# Patient Record
Sex: Female | Born: 2000 | Race: White | Hispanic: No | Marital: Single | Smoking: Never smoker
Health system: Southern US, Community
[De-identification: ages and names within clinical notes are randomized; demographics above are authoritative.]

## PROBLEM LIST (undated history)

## (undated) DIAGNOSIS — R0689 Other abnormalities of breathing: Secondary | ICD-10-CM

## (undated) DIAGNOSIS — F419 Anxiety disorder, unspecified: Secondary | ICD-10-CM

## (undated) DIAGNOSIS — R000 Tachycardia, unspecified: Secondary | ICD-10-CM

## (undated) DIAGNOSIS — E663 Overweight: Secondary | ICD-10-CM

## (undated) DIAGNOSIS — N1370 Vesicoureteral-reflux, unspecified: Secondary | ICD-10-CM

## (undated) HISTORY — PX: NO PAST SURGERIES: SHX2092

## (undated) HISTORY — DX: Vesicoureteral-reflux, unspecified: N13.70

## (undated) HISTORY — DX: Overweight: E66.3

## (undated) HISTORY — DX: Other abnormalities of breathing: R06.89

## (undated) HISTORY — DX: Tachycardia, unspecified: R00.0

---
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
## 2022-05-07 DIAGNOSIS — N1370 Vesicoureteral-reflux, unspecified: Secondary | ICD-10-CM | POA: Insufficient documentation

## 2022-05-07 DIAGNOSIS — R21 Rash and other nonspecific skin eruption: Secondary | ICD-10-CM | POA: Insufficient documentation

## 2022-05-07 DIAGNOSIS — Z91038 Other insect allergy status: Secondary | ICD-10-CM | POA: Insufficient documentation

## 2022-05-07 DIAGNOSIS — L509 Urticaria, unspecified: Secondary | ICD-10-CM | POA: Insufficient documentation

## 2022-05-07 DIAGNOSIS — B354 Tinea corporis: Secondary | ICD-10-CM | POA: Insufficient documentation

## 2022-05-07 DIAGNOSIS — E65 Localized adiposity: Secondary | ICD-10-CM | POA: Insufficient documentation

## 2022-05-07 DIAGNOSIS — S01502A Unspecified open wound of oral cavity, initial encounter: Secondary | ICD-10-CM | POA: Insufficient documentation

## 2022-05-07 DIAGNOSIS — E663 Overweight: Secondary | ICD-10-CM | POA: Insufficient documentation

## 2022-05-07 DIAGNOSIS — R112 Nausea with vomiting, unspecified: Secondary | ICD-10-CM | POA: Insufficient documentation

## 2022-05-07 DIAGNOSIS — R509 Fever, unspecified: Secondary | ICD-10-CM | POA: Insufficient documentation

## 2022-05-07 DIAGNOSIS — R011 Cardiac murmur, unspecified: Secondary | ICD-10-CM | POA: Insufficient documentation

## 2022-05-07 DIAGNOSIS — R059 Cough, unspecified: Secondary | ICD-10-CM | POA: Insufficient documentation

## 2022-05-07 DIAGNOSIS — Z23 Encounter for immunization: Secondary | ICD-10-CM | POA: Insufficient documentation

## 2022-05-07 DIAGNOSIS — K529 Noninfective gastroenteritis and colitis, unspecified: Secondary | ICD-10-CM | POA: Insufficient documentation

## 2022-05-07 DIAGNOSIS — D72829 Elevated white blood cell count, unspecified: Secondary | ICD-10-CM | POA: Insufficient documentation

## 2022-05-07 DIAGNOSIS — J45990 Exercise induced bronchospasm: Secondary | ICD-10-CM | POA: Insufficient documentation

## 2022-05-07 DIAGNOSIS — J45909 Unspecified asthma, uncomplicated: Secondary | ICD-10-CM | POA: Insufficient documentation

## 2022-05-07 DIAGNOSIS — L42 Pityriasis rosea: Secondary | ICD-10-CM | POA: Insufficient documentation

## 2022-05-07 DIAGNOSIS — K5909 Other constipation: Secondary | ICD-10-CM | POA: Insufficient documentation

## 2022-05-07 DIAGNOSIS — R0689 Other abnormalities of breathing: Secondary | ICD-10-CM | POA: Insufficient documentation

## 2022-05-07 DIAGNOSIS — M439 Deforming dorsopathy, unspecified: Secondary | ICD-10-CM | POA: Insufficient documentation

## 2022-05-08 ENCOUNTER — Ambulatory Visit: Payer: Self-pay

## 2022-05-08 ENCOUNTER — Ambulatory Visit: Payer: Self-pay | Admitting: Physician Assistant

## 2022-05-08 ENCOUNTER — Encounter: Payer: Self-pay | Admitting: Physician Assistant

## 2022-05-08 VITALS — BP 118/77 | HR 91 | Temp 97.7°F | Resp 12 | Ht 59.0 in | Wt 160.0 lb

## 2022-05-08 DIAGNOSIS — Z021 Encounter for pre-employment examination: Secondary | ICD-10-CM

## 2022-05-08 DIAGNOSIS — Z0000 Encounter for general adult medical examination without abnormal findings: Secondary | ICD-10-CM

## 2022-05-08 LAB — POCT URINALYSIS DIPSTICK
Bilirubin, UA: NEGATIVE
Blood, UA: NEGATIVE
Glucose, UA: NEGATIVE
Ketones, UA: NEGATIVE
Leukocytes, UA: NEGATIVE
Nitrite, UA: NEGATIVE
Protein, UA: NEGATIVE
Spec Grav, UA: 1.01 (ref 1.010–1.025)
Urobilinogen, UA: 0.2 E.U./dL
pH, UA: 6 (ref 5.0–8.0)

## 2022-05-08 NOTE — Progress Notes (Signed)
Pt presents today to complete pre-employment [employer] UDS and physical.

## 2022-05-08 NOTE — Progress Notes (Signed)
[facility]  ____________________________________________   None    (approximate)  I have reviewed the triage vital signs and the nursing notes.   HISTORY  Chief Complaint No chief complaint on file.    HPI Denise Richard is a 21 y.o. female patient presents for employment physical for the [employer].  Patient is applying for telemetry communicator.         Past Medical History:  Diagnosis Date   Anxiety    Breath holding episodes    seen by cardiologist in the past   Overweight child    Reflux, vesicoureteral    Seen by Urologist in the past and had voiding test performed.   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   Breath holding episodes 05/07/2022   [place] hump 05/07/2022   Chronic constipation 05/07/2022   Cough 05/07/2022   Curvature of spine 05/07/2022   Dermatophytosis of body 05/07/2022   Elevated WBC count 05/07/2022   Fever 05/07/2022   Gastroenteritis 05/07/2022   Nausea with vomiting 05/07/2022   Need for varicella vaccine 05/07/2022   Open wound of tongue and floor of mouth 05/07/2022   Overweight child 05/07/2022   Pityriasis rosea 05/07/2022   RAD (reactive airway disease) 05/07/2022   Rash and other nonspecific skin eruption 05/07/2022   Reflux, vesicoureteral 05/07/2022   Undiagnosed cardiac murmurs 05/07/2022   Urticaria 05/07/2022   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 Authorizing Provider  aspirin EC 325 MG tablet Take by mouth. 11/08/18   [provider]  metFORMIN (GLUCOPHAGE) 500 MG tablet Take by mouth. 03/21/21   [provider]  norethindrone-ethinyl estradiol (JUNEL FE,GILDESS FE,LOESTRIN FE) 1-20 MG-MCG tablet Take 1 tablet by mouth daily. 02/02/17 02/02/18  [provider]    Allergies Pecan pollen allergy skin test and Juniper tar  Family History  Problem Relation Age of Onset   Cancer Paternal Aunt        Great Aunt-Breast   Hyperlipidemia Maternal Grandfather    Heart disease Maternal Grandfather    Blindness Maternal Grandfather    Diabetes Paternal Grandfather    Anxiety disorder Mother    Hyperlipidemia Maternal Grandmother    Anxiety disorder Maternal Grandmother    Anxiety disorder Paternal Grandmother     Social History Social History   Tobacco Use   Smoking status: Never   Smokeless tobacco: Never  Vaping Use   Vaping Use: Never used  Substance Use Topics   Alcohol use: No    Alcohol/week: 0.0 standard drinks of alcohol   Drug use: No    Review of Systems Constitutional: No fever/chills Eyes: No visual changes. ENT: No sore throat. Cardiovascular: Denies chest pain. Respiratory: Denies shortness of breath.  Asthma. Gastrointestinal: No abdominal pain.  No nausea, no vomiting.  No diarrhea.  No constipation. Genitourinary: Negative for dysuria.  Musculoskeletal: Negative for back pain. Skin: Negative for rash. Neurological: Negative for headaches, focal weakness or numbness. Psychiatric: Anxiety Allergic/Immunilogical: Pecan pollen ____________________________________________   PHYSICAL EXAM:  VITAL SIGNS: Constitutional: Alert and oriented. Well appearing and in no acute distress. Eyes: Conjunctivae are normal. PERRL. EOMI. Head: Atraumatic. Nose: No congestion/rhinnorhea. Mouth/Throat: Mucous membranes are moist.   Oropharynx non-erythematous. Neck: No stridor.  No cervical spine tenderness to palpation. Hematological/Lymphatic/Immunilogical: No cervical lymphadenopathy. Cardiovascular: Normal rate, regular rhythm. Grossly normal heart sounds.  Good peripheral circulation. Respiratory: Normal respiratory effort.  No retractions. Lungs CTAB. Gastrointestinal: Soft and nontender. No distention. No abdominal bruits. No CVA tenderness. Genitourinary: Deferred Musculoskeletal: No lower extremity tenderness nor edema.  No joint effusions. Neurologic:  Normal speech and language. No gross focal neurologic deficits are appreciated. No gait instability. Skin:  Skin is warm, dry and intact. No rash noted. Psychiatric: Mood and affect are normal. Speech and behavior are normal.  ____________________________________________   LABS  __Pending __________________________________________    ____________________________________________   INITIAL IMPRESSION / ASSESSMENT AND PLAN  As part of my medical decision making, I reviewed the following data within the electronic MEDICAL RECORD NUMBER              ____________________________________________   FINAL CLINICAL IMPRESSION  On exam  ED Discharge Orders     None        Note:  This document was prepared using Dragon voice recognition software and may include unintentional dictation errors.

## 2022-05-09 LAB — CMP12+LP+TP+TSH+6AC+CBC/D/PLT
ALT: 28 IU/L (ref 0–32)
AST: 25 IU/L (ref 0–40)
Albumin/Globulin Ratio: 2 (ref 1.2–2.2)
Albumin: 4.8 g/dL (ref 3.9–5.0)
Alkaline Phosphatase: 160 IU/L — ABNORMAL HIGH (ref 42–106)
BUN/Creatinine Ratio: 15 (ref 9–23)
BUN: 9 mg/dL (ref 6–20)
Basophils Absolute: 0.1 10*3/uL (ref 0.0–0.2)
Basos: 1 %
Bilirubin Total: 0.3 mg/dL (ref 0.0–1.2)
Calcium: 9.7 mg/dL (ref 8.7–10.2)
Chloride: 99 mmol/L (ref 96–106)
Chol/HDL Ratio: 3 ratio (ref 0.0–4.4)
Cholesterol, Total: 177 mg/dL — ABNORMAL HIGH (ref 100–169)
Creatinine, Ser: 0.61 mg/dL (ref 0.57–1.00)
EOS (ABSOLUTE): 0.3 10*3/uL (ref 0.0–0.4)
Eos: 3 %
Estimated CHD Risk: <0.5 times avg.
Free Thyroxine Index: 1.6 (ref 1.2–4.9)
GGT: 18 IU/L (ref 0–60)
Globulin, Total: 2.4 g/dL (ref 1.5–4.5)
Glucose: 65 mg/dL — ABNORMAL LOW (ref 70–99)
HDL: 60 mg/dL (ref >39)
Hematocrit: 41.5 % (ref 34.0–46.6)
Hemoglobin: 13.7 g/dL (ref 11.1–15.9)
Immature Grans (Abs): 0 10*3/uL (ref 0.0–0.1)
Immature Granulocytes: 0 %
Iron: 85 ug/dL (ref 27–159)
LDH: 225 IU/L (ref 119–226)
LDL Chol Calc (NIH): 92 mg/dL (ref 0–109)
Lymphocytes Absolute: 3.3 10*3/uL — ABNORMAL HIGH (ref 0.7–3.1)
Lymphs: 32 %
MCH: 27.7 pg (ref 26.6–33.0)
MCHC: 33 g/dL (ref 31.5–35.7)
MCV: 84 fL (ref 79–97)
Monocytes Absolute: 0.6 10*3/uL (ref 0.1–0.9)
Monocytes: 6 %
Neutrophils Absolute: 6 10*3/uL (ref 1.4–7.0)
Neutrophils: 58 %
Phosphorus: 4.3 mg/dL (ref 3.3–5.1)
Platelets: 335 10*3/uL (ref 150–450)
Potassium: 4.4 mmol/L (ref 3.5–5.2)
RBC: 4.94 x10E6/uL (ref 3.77–5.28)
RDW: 12.7 % (ref 11.7–15.4)
Sodium: 139 mmol/L (ref 134–144)
T3 Uptake Ratio: 23 % (ref 23–35)
T4, Total: 6.8 ug/dL (ref 4.5–12.0)
TSH: 1.85 u[IU]/mL (ref 0.450–4.500)
Total Protein: 7.2 g/dL (ref 6.0–8.5)
Triglycerides: 144 mg/dL — ABNORMAL HIGH (ref 0–89)
Uric Acid: 4.4 mg/dL (ref 2.6–6.2)
VLDL Cholesterol Cal: 25 mg/dL (ref 5–40)
WBC: 10.2 10*3/uL (ref 3.4–10.8)
eGFR: 133 mL/min/{1.73_m2} (ref >59)

## 2022-05-12 LAB — QUANTIFERON-TB GOLD PLUS
QuantiFERON Mitogen Value: >10 IU/mL
QuantiFERON Nil Value: 0.08 IU/mL
QuantiFERON TB1 Ag Value: 0.06 IU/mL
QuantiFERON TB2 Ag Value: 0.06 IU/mL
QuantiFERON-TB Gold Plus: NEGATIVE

## 2024-03-01 ENCOUNTER — Ambulatory Visit
Admission: EM | Admit: 2024-03-01 | Discharge: 2024-03-01 | Disposition: A | Discharge status: Home / Self Care | Attending: Emergency Medicine | Admitting: Emergency Medicine

## 2024-03-01 ENCOUNTER — Other Ambulatory Visit: Payer: Self-pay

## 2024-03-01 ENCOUNTER — Encounter: Payer: Self-pay | Admitting: Emergency Medicine

## 2024-03-01 DIAGNOSIS — J069 Acute upper respiratory infection, unspecified: Secondary | ICD-10-CM

## 2024-03-01 DIAGNOSIS — H6502 Acute serous otitis media, left ear: Secondary | ICD-10-CM

## 2024-03-01 LAB — POC COVID19/FLU A&B COMBO
Covid Antigen, POC: NEGATIVE
Influenza A Antigen, POC: NEGATIVE
Influenza B Antigen, POC: NEGATIVE

## 2024-03-01 MED ORDER — PREDNISONE 10 MG (21) PO TBPK: ORAL_TABLET | Freq: Every day | ORAL | 0 refills | Status: AC

## 2024-03-01 MED ORDER — BENZONATATE 100 MG PO CAPS: 100.0000 mg | ORAL_CAPSULE | Freq: Three times a day (TID) | ORAL | 0 refills | Status: AC

## 2024-03-01 MED ORDER — AMOXICILLIN-POT CLAVULANATE 875-125 MG PO TABS: 1.0000 | ORAL_TABLET | Freq: Two times a day (BID) | ORAL | 0 refills | Status: AC

## 2024-03-01 MED ORDER — PROMETHAZINE-DM 6.25-15 MG/5ML PO SYRP: 5.0000 mL | ORAL_SOLUTION | Freq: Every evening | ORAL | 0 refills | Status: AC | PRN

## 2024-03-01 NOTE — Discharge Instructions (Signed)
 Begin Augmentin every morning and every evening for 7 days for treatment of infection to the left ear will provide protection to the lungs  Begin prednisone starting tomorrow morning, take with food as directed, will open to relax airway and ideally settle the harshness of your cough  You may use Tessalon pill every 8 hours as needed for cough, use cough syrup at bedtime if having difficulty sleeping    You can take Tylenol and/or Ibuprofen as needed for fever reduction and pain relief.   For cough: honey 1/2 to 1 teaspoon (you can dilute the honey in water or another fluid).  You can also use guaifenesin and dextromethorphan for cough. You can use a humidifier for chest congestion and cough.  If you don't have a humidifier, you can sit in the bathroom with the hot shower running.      For sore throat: try warm salt water gargles, cepacol lozenges, throat spray, warm tea or water with lemon/honey, popsicles or ice, or OTC cold relief medicine for throat discomfort.   For congestion: take a daily anti-histamine like Zyrtec, Claritin, and a oral decongestant, such as pseudoephedrine.  You can also use Flonase 1-2 sprays in each nostril daily.   It is important to stay hydrated: drink plenty of fluids (water, gatorade/powerade/pedialyte, juices, or teas) to keep your throat moisturized and help further relieve irritation/discomfort.

## 2024-03-01 NOTE — ED Provider Notes (Signed)
 Renaldo Fiddler    CSN: [id number] Arrival date & time: 03/01/24  1911      History   Chief Complaint Chief Complaint  Patient presents with   Fever    HPI Denise Richard is a 23 y.o. female.   Patient presents for evaluation of a fever peaking at 101.2, nasal congestion, rhinorrhea, productive cough, sore throat and bilateral ear fullness present for 3 days.  Ear fullness described as a sensation of needing the ears to pop but denies pain, drainage or decreased hearing.  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  Decreased appetite but able to tolerate food and liquids.  Denies shortness of breath and wheezing.  Has attempted use of ibuprofen and Alka-Seltzer which has been minimally effective.   Past Medical History:  Diagnosis Date   Anxiety    Breath holding episodes    seen by cardiologist in the past   Overweight child    Reflux, vesicoureteral    Seen by Urologist in the past and had voiding test performed.   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   Breath holding episodes 05/07/2022   Dorsocervical fat pad 05/07/2022   Chronic constipation 05/07/2022   Cough 05/07/2022   Curvature of spine 05/07/2022   Dermatophytosis of body 05/07/2022   Elevated WBC count 05/07/2022   Fever 05/07/2022   Gastroenteritis 05/07/2022   Nausea with vomiting 05/07/2022   Need for varicella vaccine 05/07/2022   Open wound of tongue and floor of mouth 05/07/2022   Overweight child 05/07/2022   Pityriasis rosea 05/07/2022   RAD (reactive airway disease) 05/07/2022   Rash and other nonspecific skin eruption 05/07/2022   Reflux, vesicoureteral 05/07/2022   Undiagnosed cardiac murmurs 05/07/2022   Urticaria 05/07/2022   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      Home Medications    Prior to Admission medications   Medication Sig Start Date End Date Taking? Authorizing Provider  amoxicillin-clavulanate (AUGMENTIN) 875-125 MG tablet Take 1 tablet by mouth every 12 (twelve) hours. 03/01/24  Yes Britt Theard R, NP  benzonatate (TESSALON) 100 MG capsule Take 1 capsule (100 mg total) by mouth every 8 (eight) hours. 03/01/24  Yes Rajah Lamba R, NP  predniSONE (STERAPRED UNI-PAK 21 TAB) 10 MG (21) TBPK tablet Take by mouth daily. Take 6 tabs by mouth daily  for 1 days, then 5 tabs for 1 days, then 4 tabs for 1 days, then 3 tabs for 1 days, 2 tabs for 1 days, then 1 tab by mouth daily for 1 days 03/01/24  Yes Domonik Levario R, NP  promethazine-dextromethorphan (PROMETHAZINE-DM) 6.25-15 MG/5ML syrup Take 5 mLs by mouth at bedtime as needed. 03/01/24  Yes Onur Mori R, NP  FLUoxetine (PROZAC) 20 MG capsule Take 20 mg by mouth daily. 04/29/22   [provider]  metFORMIN (GLUCOPHAGE) 500 MG tablet Take  by mouth. 03/21/21   [provider]    Family History Family History  Problem Relation Age of Onset   Cancer Paternal Aunt        Great Aunt-Breast   Hyperlipidemia Maternal Grandfather    Heart disease Maternal Grandfather    Blindness Maternal Grandfather    Diabetes Paternal Grandfather    Anxiety disorder Mother    Hyperlipidemia Maternal Grandmother    Anxiety disorder Maternal Grandmother    Anxiety disorder Paternal Grandmother     Social History Social History   Tobacco Use   Smoking status: Never   Smokeless tobacco: Never  Vaping Use   Vaping status: Never Used  Substance Use  Topics   Alcohol use: No    Alcohol/week: 0.0 standard drinks of alcohol   Drug use: No     Allergies   Pecan pollen allergy skin test and Juniper tar   Review of Systems Review of Systems   Physical Exam Triage Vital Signs ED Triage Vitals  Encounter Vitals Group     BP 03/01/24 1925 102/68     Systolic BP Percentile --      Diastolic BP Percentile --      Pulse Rate 03/01/24 1925 (!) 112     Resp 03/01/24 1925 20     Temp 03/01/24 1925 99.1 F (37.3 C)     Temp Source 03/01/24 1925 Oral     SpO2 03/01/24 1925 98 %     Weight --      Height --      Head Circumference --      Peak Flow --      Pain Score 03/01/24 1926 4     Pain Loc --      Pain Education --      Exclude from Growth Chart --    No data found.  Updated Vital Signs BP 102/68 (BP Location: Left Arm)   Pulse (!) 112   Temp 99.1 F (37.3 C) (Oral)   Resp 20   SpO2 98%   Visual Acuity Right Eye Distance:   Left Eye Distance:   Bilateral Distance:    Right Eye Near:   Left Eye Near:    Bilateral Near:     Physical Exam Constitutional:      Appearance: Normal appearance.  HENT:     Head: Normocephalic.     Right Ear: Tympanic membrane, ear canal and external ear normal.     Left Ear: Hearing, ear canal and external ear normal. Tympanic membrane is erythematous.     Nose: Congestion present.     Mouth/Throat:     Pharynx: No oropharyngeal exudate or posterior oropharyngeal erythema.  Eyes:     Extraocular Movements: Extraocular movements intact.  Cardiovascular:     Rate and Rhythm: Normal rate and regular rhythm.     Pulses: Normal pulses.     Heart sounds: Normal heart sounds.  Pulmonary:     Effort: Pulmonary effort is normal.     Breath sounds: Normal breath sounds.     Comments: Harsh prominent cough witnessed Musculoskeletal:     Cervical back: Normal range of motion and neck supple.  Neurological:     Mental Status: She is alert and oriented to person, place, and time.  Mental status is at baseline.      UC Treatments / Results  Labs (all labs ordered are listed, but only abnormal results are displayed) Labs Reviewed  POC COVID19/FLU A&B COMBO - Normal  EKG   Radiology No results found.  Procedures Procedures (including critical care time)  Medications Ordered in UC Medications - No data to display  Initial Impression / Assessment and Plan / UC Course  I have reviewed the triage vital signs and the nursing notes.  Pertinent labs & imaging results that were available during my care of the patient were reviewed by me and considered in my medical decision making (see chart for details).  Acute URI, nonrecurrent acute serous otitis media of the left ear  Vitals are stable, patient in no signs of distress nontoxic-appearing, harsh cough with this but lungs are clear to auscultation and O2 saturation 98% on room air, stable for outpatient management, erythema noted to the left tympanic membrane without abnormality to the canal or external ear, prescribed Augmentin for treatment and additionally prescribed prednisone, Tessalon and Promethazine DM, recommended supportive care with follow-up as needed Final Clinical Impressions(s) / UC Diagnoses   Final diagnoses:  Non-recurrent acute serous otitis media of left ear  Acute URI     Discharge Instructions      Begin Augmentin every morning and every evening for 7 days for treatment of infection to the left ear will provide protection to the lungs  Begin prednisone starting tomorrow morning, take with food as directed, will open to relax airway and ideally settle the harshness of your cough  You may use Tessalon pill every 8 hours as needed for cough, use cough syrup at bedtime if having difficulty sleeping    You can take Tylenol and/or Ibuprofen as needed for fever reduction and pain relief.   For cough: honey 1/2 to 1 teaspoon (you can dilute the honey in water or another fluid).  You can  also use guaifenesin and dextromethorphan for cough. You can use a humidifier for chest congestion and cough.  If you don't have a humidifier, you can sit in the bathroom with the hot shower running.      For sore throat: try warm salt water gargles, cepacol lozenges, throat spray, warm tea or water with lemon/honey, popsicles or ice, or OTC cold relief medicine for throat discomfort.   For congestion: take a daily anti-histamine like Zyrtec, Claritin, and a oral decongestant, such as pseudoephedrine.  You can also use Flonase 1-2 sprays in each nostril daily.   It is important to stay hydrated: drink plenty of fluids (water, gatorade/powerade/pedialyte, juices, or teas) to keep your throat moisturized and help further relieve irritation/discomfort.    ED Prescriptions     Medication Sig Dispense Auth. Provider   amoxicillin-clavulanate (AUGMENTIN) 875-125 MG tablet Take 1 tablet by mouth every 12 (twelve) hours. 14 tablet Viridiana Spaid R, NP   predniSONE (STERAPRED UNI-PAK 21 TAB) 10 MG (21) TBPK tablet Take by mouth daily. Take 6 tabs by mouth daily  for 1 days, then 5 tabs for 1 days, then 4 tabs for 1 days, then 3 tabs for 1 days, 2 tabs for 1 days, then 1 tab by mouth daily for 1 days 21 tablet Amariyana Heacox R, NP   benzonatate (TESSALON) 100 MG capsule Take 1 capsule (100 mg total) by mouth every 8 (eight) hours. 21 capsule Lisette Mancebo R, NP   promethazine-dextromethorphan (PROMETHAZINE-DM) 6.25-15 MG/5ML syrup Take 5 mLs by mouth at bedtime as needed. 118 mL Emrey Thornley, Elita Boone, NP      PDMP not reviewed this encounter.   Valinda Hoar, [place] 03/01/24 [phone]

## 2024-03-01 NOTE — ED Triage Notes (Signed)
 Patient presents to [facility] for evaluation of fever since Saturday with cough, stuffy nose, sore throat.
